# Patient Record
Sex: Female | Born: 2000 | Race: Black or African American | Hispanic: No | Marital: Single | Smoking: Never smoker
Health system: Southern US, Community
[De-identification: ages and names within clinical notes are randomized; demographics above are authoritative.]

## PROBLEM LIST (undated history)

## (undated) ENCOUNTER — Ambulatory Visit (HOSPITAL_COMMUNITY): Admission: EM | Payer: 59

## (undated) DIAGNOSIS — K5900 Constipation, unspecified: Secondary | ICD-10-CM

---

## 2018-05-24 ENCOUNTER — Emergency Department (HOSPITAL_COMMUNITY): Payer: 59

## 2018-05-24 ENCOUNTER — Emergency Department (HOSPITAL_COMMUNITY)
Admission: EM | Admit: 2018-05-24 | Discharge: 2018-05-24 | Disposition: A | Discharge status: Home / Self Care | Payer: 59 | Attending: Emergency Medicine | Admitting: Emergency Medicine

## 2018-05-24 ENCOUNTER — Other Ambulatory Visit: Payer: Self-pay

## 2018-05-24 ENCOUNTER — Encounter (HOSPITAL_COMMUNITY): Payer: Self-pay | Admitting: Emergency Medicine

## 2018-05-24 DIAGNOSIS — Z79899 Other long term (current) drug therapy: Secondary | ICD-10-CM | POA: Insufficient documentation

## 2018-05-24 DIAGNOSIS — K5900 Constipation, unspecified: Secondary | ICD-10-CM | POA: Insufficient documentation

## 2018-05-24 DIAGNOSIS — Z91010 Allergy to peanuts: Secondary | ICD-10-CM | POA: Diagnosis not present

## 2018-05-24 DIAGNOSIS — K5909 Other constipation: Secondary | ICD-10-CM

## 2018-05-24 HISTORY — DX: Constipation, unspecified: K59.00

## 2018-05-24 MED ORDER — FLEET ENEMA 7-19 GM/118ML RE ENEM
1.0000 | ENEMA | Freq: Once | RECTAL | Status: AC
  Administered 2018-05-24: 1 via RECTAL
  Filled 2018-05-24: qty 1

## 2018-05-24 MED ORDER — POLYETHYLENE GLYCOL 3350 17 GM/SCOOP PO POWD: 1.0000 | Freq: Once | ORAL | 0 refills | Status: AC

## 2018-05-24 NOTE — Discharge Instructions (Signed)
Repeat a fleets enema in 1 hour.  Begin miralax.  Increase your dietary fiber.

## 2018-05-24 NOTE — ED Notes (Signed)
Patient verbalizes understanding of medications and discharge instructions. No further questions at this time. VSS and patient ambulatory at discharge.   

## 2018-05-24 NOTE — ED Provider Notes (Signed)
[facility] EMERGENCY DEPARTMENT Provider Note   CSN: [id number] Arrival date & time: 05/24/18  1648     History   Chief Complaint Chief Complaint  Patient presents with  . Fecal Impaction    HPI Karen Atkinson is a 17 y.o. female.  The history is provided by the patient. No language interpreter was used.  Constipation   This is a chronic problem. The stool is described as firm. There is no fiber in the patient's diet. She exercises regularly. There has been adequate water intake. She has tried nothing for the symptoms. The treatment provided no relief. Her past medical history does not include irritable bowel syndrome.   Pt complains of severe constipation.  Pt reports she feels blocked now.  Past Medical History:  Diagnosis Date  . Constipation     There are no active problems to display for this patient.   History reviewed. No pertinent surgical history.   OB History   None      Home Medications    Prior to Admission medications   Medication Sig Start Date End Date Taking? Authorizing Provider  bisacodyl (BISACODYL) 5 MG EC tablet Take 5 mg by mouth once.   Yes [provider]  ibuprofen (ADVIL,MOTRIN) 200 MG tablet Take 600 mg by mouth daily as needed for headache or cramping (pain).   Yes [provider]  polyethylene glycol powder (MIRALAX) powder Take 255 g by mouth once for 1 dose. 05/24/18 05/24/18  Elson Areas, PA-C    Family History No family history on file.  Social History Social History   Tobacco Use  . Smoking status: Never Smoker  . Smokeless tobacco: Never Used  Substance Use Topics  . Alcohol use: Not Currently  . Drug use: Never     Allergies   Peanut-containing drug products   Review of Systems Review of Systems  Gastrointestinal: Positive for constipation.  All other systems reviewed and are negative.    Physical Exam Updated Vital Signs BP 114/65   Pulse 75   Temp 98.5 F  (36.9 C) (Oral)   Resp 16   LMP 05/17/2018   SpO2 100%   Physical Exam  Constitutional: She appears well-developed and well-nourished.  HENT:  Head: Normocephalic.  Right Ear: External ear normal.  Left Ear: External ear normal.  Mouth/Throat: Oropharynx is clear and moist.  Eyes: Pupils are equal, round, and reactive to light.  Cardiovascular: Normal rate.  Pulmonary/Chest: Effort normal.  Abdominal: Soft. She exhibits no mass.  Musculoskeletal: Normal range of motion.  Neurological: She is alert.  Skin: Skin is warm.  Psychiatric: She has a normal mood and affect.  Nursing note and vitals reviewed.    ED Treatments / Results  Labs (all labs ordered are listed, but only abnormal results are displayed) Labs Reviewed - No data to display  EKG None  Radiology Dg Abdomen 1 View  Result Date: 05/24/2018 CLINICAL DATA:  Patient says she has had fecal impaction since she was a child. Patient came in for rectal pain. Last bowel movement was 9 days ago. EXAM: ABDOMEN - 1 VIEW COMPARISON:  None. FINDINGS: There is significant stool burden with rectosigmoid fecal impaction. No evidence for small bowel dilatation. No organomegaly or abnormal calcifications. IMPRESSION: Large stool burden. Rectosigmoid impaction of stool. Electronically Signed   By: Norva Pavlov M.D.   On: 05/24/2018 19:16    Procedures Procedures (including critical care time)  Medications Ordered in ED Medications  sodium  phosphate (FLEET) 7-19 GM/118ML enema 1 enema (1 enema Rectal Given 05/24/18 2035)     An After Visit Summary was printed and given to the patient. Initial Impression / Assessment and Plan / ED Course  I have reviewed the triage vital signs and the nursing notes.  Pertinent labs & imaging results that were available during my care of the patient were reviewed by me and considered in my medical decision making (see chart for details).     Pt given a fleets enema by RN.   Pt passed a  large amount of stool.   I advised repeat fleets x 1 at home.  Start on miralax.  Increase fiber in diet.   Final Clinical Impressions(s) / ED Diagnoses   Final diagnoses:  Other constipation    ED Discharge Orders         Ordered    polyethylene glycol powder (MIRALAX) powder   Once     05/24/18 2059           Osie Cheeks 05/24/18 2107    Charlynne Pander, MD 05/26/18 1430

## 2018-05-24 NOTE — ED Triage Notes (Addendum)
C/o fecal impaction- reports chronic problem she has had since childhood.  C/o lower abd pressure and rectal pain.  Last BM 9-10 days ago.

## 2019-10-01 IMAGING — CR DG ABDOMEN 1V
2 series · 2 of 2 positions shown · non-contrast
Comparison: None.

CLINICAL DATA: Patient says she has had fecal impaction since she
was 9 days ago.

EXAM:
ABDOMEN - 1 VIEW

[abdomen kub (1 of 2)]
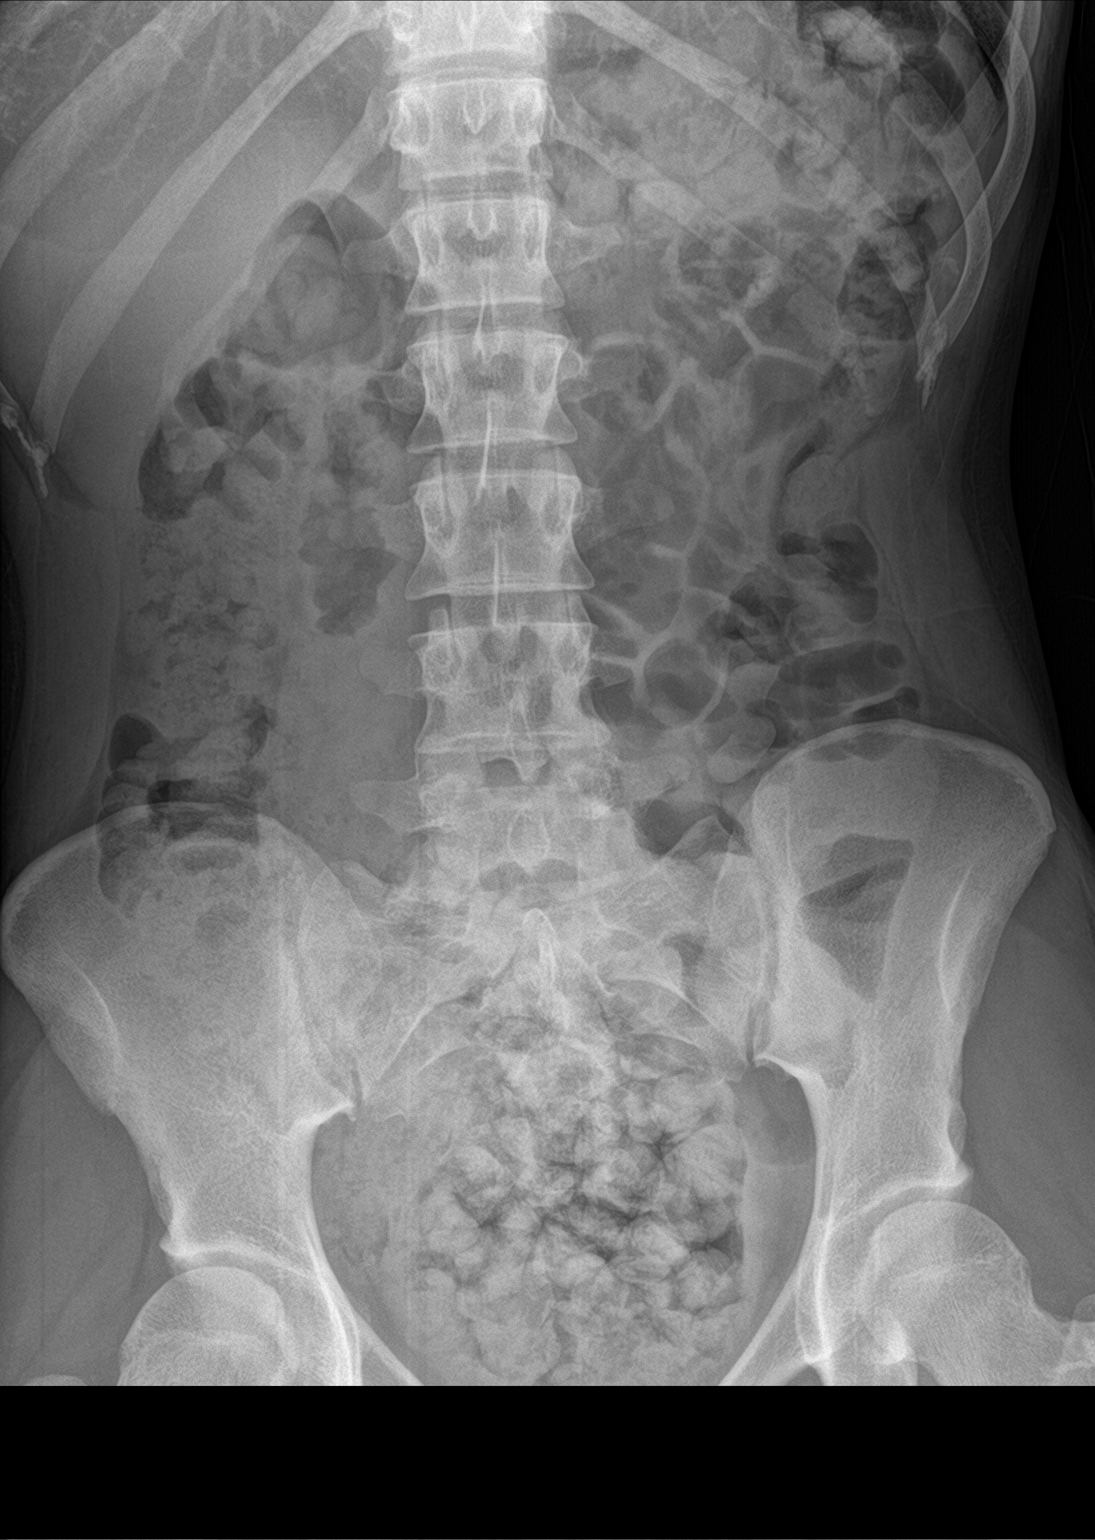

[abdomen kub (2 of 2)]
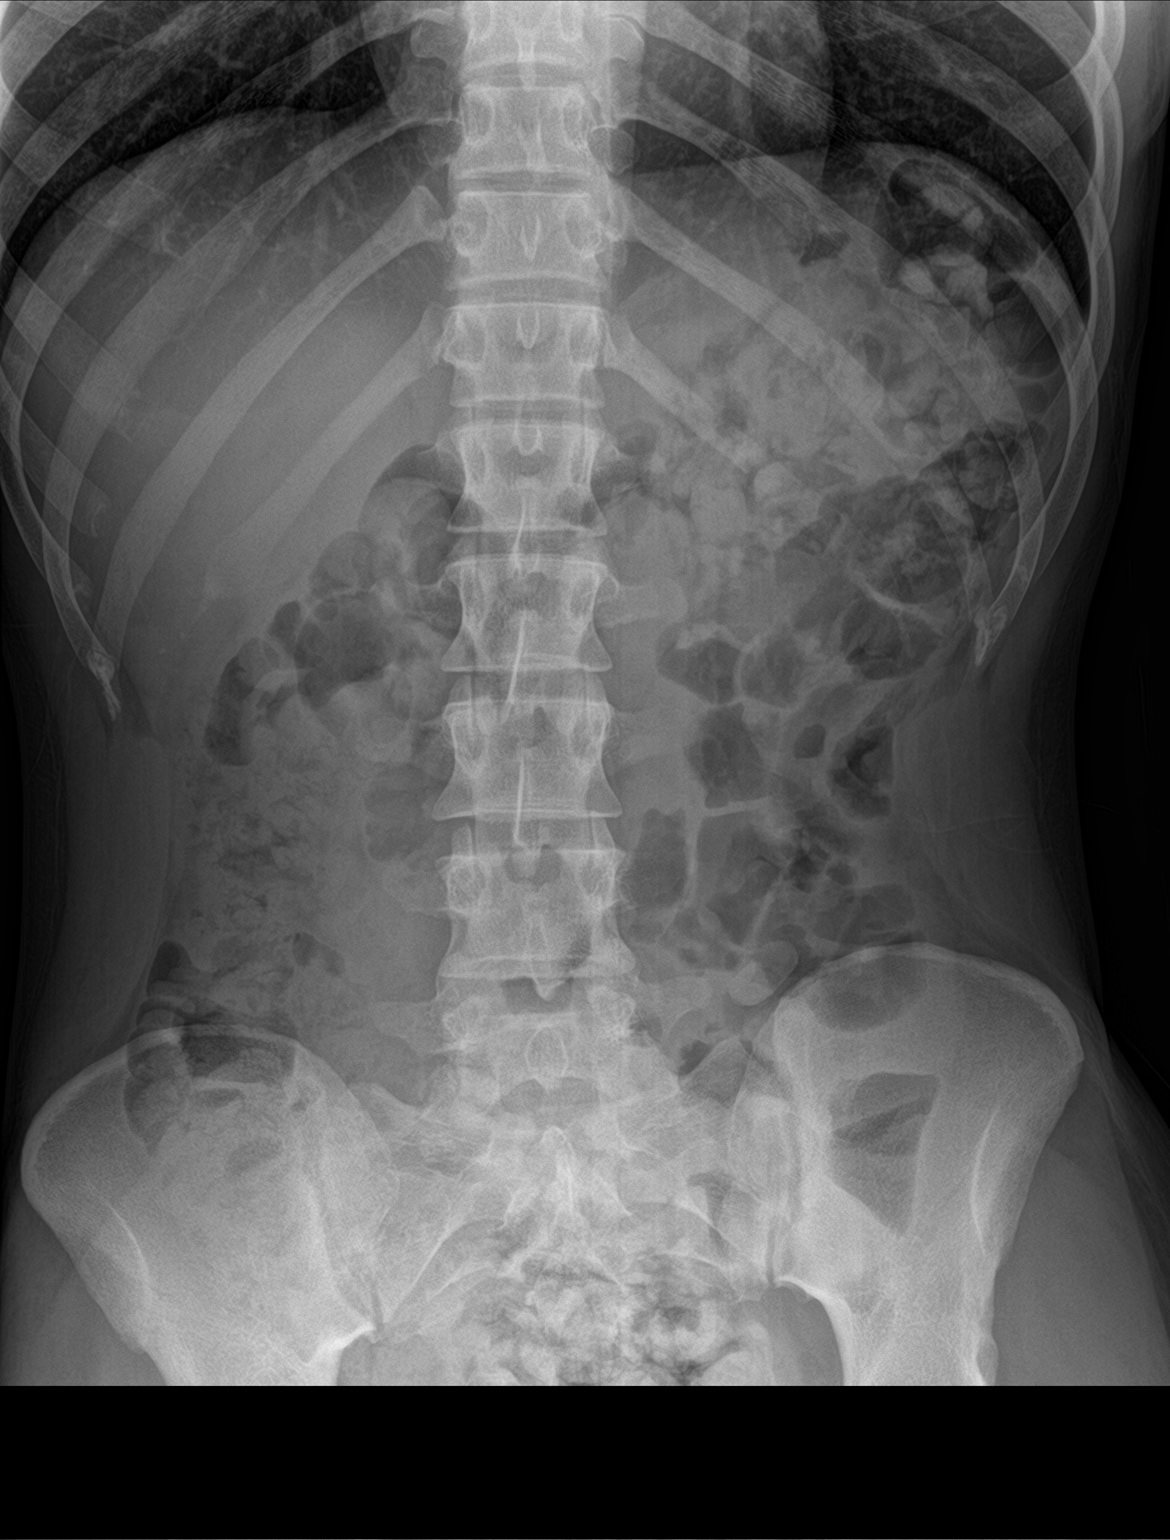

[2 of 2 positions shown; findings below may reference images not displayed]

FINDINGS: There is significant stool burden with rectosigmoid fecal impaction.
No evidence for small bowel dilatation. No organomegaly or abnormal
calcifications.
IMPRESSION: Large stool burden.

Rectosigmoid impaction of stool.
